# Patient Record
Sex: Male | Born: 1970 | Race: White | Hispanic: No | Marital: Married | Smoking: Never smoker
Health system: Southern US, Community
[De-identification: ages and names within clinical notes are randomized; demographics above are authoritative.]

---

## 2018-04-30 ENCOUNTER — Encounter: Payer: Self-pay | Admitting: "Endocrinology

## 2018-04-30 LAB — TSH: TSH: 0.01 — AB (ref 0.41–5.90)

## 2018-05-07 ENCOUNTER — Encounter: Payer: Self-pay | Admitting: "Endocrinology

## 2018-08-14 ENCOUNTER — Encounter: Payer: Self-pay | Admitting: "Endocrinology

## 2018-08-14 ENCOUNTER — Ambulatory Visit (INDEPENDENT_AMBULATORY_CARE_PROVIDER_SITE_OTHER): Payer: Self-pay | Admitting: "Endocrinology

## 2018-08-14 VITALS — BP 130/82 | HR 108 | Wt 157.0 lb

## 2018-08-14 DIAGNOSIS — E0590 Thyrotoxicosis, unspecified without thyrotoxic crisis or storm: Secondary | ICD-10-CM | POA: Insufficient documentation

## 2018-08-14 DIAGNOSIS — E0500 Thyrotoxicosis with diffuse goiter without thyrotoxic crisis or storm: Secondary | ICD-10-CM | POA: Insufficient documentation

## 2018-08-14 MED ORDER — PROPRANOLOL HCL 10 MG PO TABS: 10.0000 mg | ORAL_TABLET | Freq: Three times a day (TID) | ORAL | 2 refills | Status: DC

## 2018-08-14 NOTE — Progress Notes (Signed)
Endocrinology Consult Note    Subjective:    Patient ID: Kevin May, male    DOB: Nov 17, 1970, PCP Clemon ChambersGiles, April Y, NP.   History reviewed. No pertinent past medical history. History reviewed. No pertinent surgical history. Social History   Socioeconomic History  . Marital status: Married    Spouse name: Not on file  . Number of children: Not on file  . Years of education: Not on file  . Highest education level: Not on file  Occupational History  . Not on file  Social Needs  . Financial resource strain: Not on file  . Food insecurity:    Worry: Not on file    Inability: Not on file  . Transportation needs:    Medical: Not on file    Non-medical: Not on file  Tobacco Use  . Smoking status: Not on file  Substance and Sexual Activity  . Alcohol use: Not on file  . Drug use: Not on file  . Sexual activity: Not on file  Lifestyle  . Physical activity:    Days per week: Not on file    Minutes per session: Not on file  . Stress: Not on file  Relationships  . Social connections:    Talks on phone: Not on file    Gets together: Not on file    Attends religious service: Not on file    Active member of club or organization: Not on file    Attends meetings of clubs or organizations: Not on file    Relationship status: Not on file  Other Topics Concern  . Not on file  Social History Narrative  . Not on file   Outpatient Encounter Medications as of 08/14/2018  Medication Sig  . propranolol (INDERAL) 10 MG tablet Take 1 tablet (10 mg total) by mouth 3 (three) times daily with meals.  . [DISCONTINUED] propranolol (INDERAL) 10 MG tablet Take 10 mg by mouth 3 (three) times daily with meals.   No facility-administered encounter medications on file as of 08/14/2018.     ALLERGIES: Not on File  VACCINATION STATUS:  There is no immunization history on file for this patient.   HPI  Kevin May is 47 y.o. male who presents today with a medical history as  above. he is being seen in consultation for hyperthyroidism requested by Quintin AltoGiles, April Y, NP.  he has been dealing with symptoms of weight loss of 50 pounds, hypertensions/heat intolerance, tremors, sleep disturbance , and fatigue for approximately 3 months.  his most recent thyroid labs revealed suppressed TSH and elevated free T4.  His subsequent thyroid uptake and scan on May 07, 2018 showed 24-hour uptake of 53.7 with no evidence of nodular lesions.  he denies dysphagia, choking, shortness of breath, no recent voice change. These symptoms are progressively worsening and troubling to him.  He was initiated on propanolol 10 mg p.o. daily with minimal symptom relief.   he warts family history of thyroid dysfunction-various degrees of thyroid dysfunction in his siblings and mother.  He denies family history of thyroid malignancy.   -  he is not on any anti-thyroid medications nor on any thyroid hormone supplements.                           Review of systems  Constitutional: + weight loss, + fatigue, + subjective hyperthermia Eyes: no blurry vision, ++ xerophthalmia ENT: no sore throat, no nodules palpated in throat, no dysphagia/odynophagia,  nor hoarseness Cardiovascular: no Chest Pain, no Shortness of Breath, ++  palpitations, no leg swelling Respiratory: no cough, no SOB Gastrointestinal: no Nausea, no Vomiting, no Diarhhea Musculoskeletal: no muscle/joint aches Skin: no rashes Neurological: ++  tremors, no numbness, no tingling, no dizziness Psychiatric: no depression, -  anxiety   Objective:    BP 130/82   Pulse (!) 108   Wt 157 lb (71.2 kg)   Wt Readings from Last 3 Encounters:  08/14/18 157 lb (71.2 kg)                                                Physical exam  Constitutional:  + Appropriate weight for height, not in acute distress, + anxious state of mind Eyes: PERRLA, EOMI, + exophthalmos ENT: moist mucous membranes, +  thyromegaly, no cervical  lymphadenopathy Cardiovascular:  + Hyperactive precordial activity, ++ tachycardia, no Murmur/Rubs/Gallops Respiratory:  adequate breathing efforts, no gross chest deformity, Clear to auscultation bilaterally Gastrointestinal: abdomen soft, Non -tender, No distension, Bowel Sounds present Musculoskeletal: no gross deformities, strength intact in all four extremities Skin: moist, warm, no rashes Neurological:  +  tremor with outstretched hands,  ++ Deep Tendon Reflexes  on both lower extremities.  April 30, 2018 labs TSH <0.01, free T4 elevated at 2.8. Thyroid uptake and scan on May 07, 2018 showed a large homogeneous uptake throughout the gland.  Elevated 24-hour uptake of 53.7%.  No evidence of thyroid nodule.    Assessment & Plan:   1. 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 his history and most recent labs are reviewed, and he was examined clinically. Subjective and objective findings are consistent with thyrotoxicosis likely from primary hyperthyroidism from Graves' disease. The potential risks of untreated thyrotoxicosis and the need for definitive therapy have been discussed in detail with him, and he agrees to proceed with treatment plan.    Options of therapy are discussed with him.  We discussed the option of treating it with medications including methimazole or PTU which may have side effects including rash, transaminitis, and bone marrow suppression.  We  also discussed the option of definitive therapy with RAI ablation of the thyroid.  I favored and he  agrees with this option of treatment despite the fact that there is a risk of post ablative hypothyroidism with subsequent need for lifelong thyroid hormone replacement. he is made aware of this outcome  and he is  willing to proceed.  Although surgery is one other choice of treatment in some cases, in his case surgery is not a good fit for presentation with only  mild goiter.    I discussed and increase his propranolol to 10 mg p.o. 3 times daily for symptomatic relief.  - I advised him to maintain close follow up with Quintin Alto, NP for primary care needs.   - Time spent with the patient: 35 minutes, of which >50% was spent in obtaining information about his symptoms, reviewing his previous labs, evaluations, and treatments, counseling him about his hyperthyroidism from Graves' disease, and developing a plan to confirm the diagnosis and long term treatment as necessary.  Kevin May participated in the discussions, expressed understanding, and voiced agreement with the above plans.  All questions were answered to his satisfaction. he is encouraged  to contact clinic should he have any questions or concerns prior to his return visit.  Follow up plan: Return in about 8 weeks (around 10/09/2018) for Follow up with Labs after I131 Therapy.   Thank you for involving me in the care of this pleasant patient, and I will continue to update you with his progress.  Marquis LunchGebre Walfred Bettendorf, MD [facility] Phone: [phone]  Fax: [phone]   08/14/2018, 2:23 PM  This note was partially dictated with voice recognition software. Similar sounding words can be transcribed inadequately or may not  be corrected upon review.

## 2018-08-31 ENCOUNTER — Ambulatory Visit (HOSPITAL_COMMUNITY): Payer: Self-pay

## 2018-09-07 ENCOUNTER — Encounter (HOSPITAL_COMMUNITY)
Admission: RE | Admit: 2018-09-07 | Discharge: 2018-09-07 | Disposition: A | Discharge status: Home / Self Care | Payer: Self-pay | Source: Ambulatory Visit | Attending: "Endocrinology | Admitting: "Endocrinology

## 2018-09-07 DIAGNOSIS — E0590 Thyrotoxicosis, unspecified without thyrotoxic crisis or storm: Secondary | ICD-10-CM | POA: Insufficient documentation

## 2018-09-07 MED ORDER — SODIUM IODIDE I 131 CAPSULE
18.0000 | Freq: Once | INTRAVENOUS | Status: AC | PRN
  Administered 2018-09-07: 18.19 via ORAL

## 2018-10-09 ENCOUNTER — Ambulatory Visit: Payer: Self-pay | Admitting: "Endocrinology

## 2018-10-30 ENCOUNTER — Other Ambulatory Visit: Payer: Self-pay

## 2018-10-30 ENCOUNTER — Encounter: Payer: Self-pay | Admitting: "Endocrinology

## 2018-10-30 ENCOUNTER — Ambulatory Visit (INDEPENDENT_AMBULATORY_CARE_PROVIDER_SITE_OTHER): Payer: Self-pay | Admitting: "Endocrinology

## 2018-10-30 VITALS — BP 132/87 | HR 73 | Ht 70.0 in | Wt 178.0 lb

## 2018-10-30 DIAGNOSIS — E890 Postprocedural hypothyroidism: Secondary | ICD-10-CM | POA: Insufficient documentation

## 2018-10-30 MED ORDER — LEVOTHYROXINE SODIUM 88 MCG PO TABS: 88.0000 ug | ORAL_TABLET | Freq: Every day | ORAL | 3 refills | Status: DC

## 2018-10-30 NOTE — Progress Notes (Signed)
Endocrinology follow-up note    Subjective:    Patient ID: Kevin May, male    DOB: 11-08-1970, PCP Kevin May Kevin May, Kevin May.   History reviewed. No pertinent past medical history. History reviewed. No pertinent surgical history. Social History   Socioeconomic History  . Marital status: Married    Spouse name: Not on file  . Number of children: Not on file  . Years of education: Not on file  . Highest education level: Not on file  Occupational History  . Not on file  Social Needs  . Financial resource strain: Not on file  . Food insecurity:    Worry: Not on file    Inability: Not on file  . Transportation needs:    Medical: Not on file    Non-medical: Not on file  Tobacco Use  . Smoking status: Never Smoker  . Smokeless tobacco: Never Used  Substance and Sexual Activity  . Alcohol use: Not Currently  . Drug use: Never  . Sexual activity: Not on file  Lifestyle  . Physical activity:    Days per week: Not on file    Minutes per session: Not on file  . Stress: Not on file  Relationships  . Social connections:    Talks on phone: Not on file    Gets together: Not on file    Attends religious service: Not on file    Active member of club or organization: Not on file    Attends meetings of clubs or organizations: Not on file    Relationship status: Not on file  Other Topics Concern  . Not on file  Social History Narrative  . Not on file   Outpatient Encounter Medications as of 10/30/2018  Medication Sig  . levothyroxine (SYNTHROID, LEVOTHROID) 88 MCG tablet Take 1 tablet (88 mcg total) by mouth daily before breakfast.  . [DISCONTINUED] propranolol (INDERAL) 10 MG tablet Take 1 tablet (10 mg total) by mouth 3 (three) times daily with meals.   No facility-administered encounter medications on file as of 10/30/2018.     ALLERGIES: No Known Allergies  VACCINATION STATUS:  There is no immunization history on file for this patient.   HPI  Kevin May is  48 May.o. male who presents today with a medical history as above. he is status post RAI thyroid ablation on September 07, 2018. He is returning for follow-up.  He feels better, regaining some of the weight he lost prior to treatment.  He lost approximately 50 pounds of weight when he was thyrotoxic.  He sleeps better, has better energy.  He denies palpitations, tremors, heat intolerance.    -His thyroid uptake and scan on May 07, 2018 showed 24-hour uptake of 53.7 % with no evidence of nodular lesions.  he denies dysphagia, choking, shortness of breath, no recent voice change. -He remains on propanolol 10 mg p.o. twice daily.    he reports  family history of thyroid dysfunction-various degrees of thyroid dysfunction in his siblings and mother.  He denies family history of thyroid malignancy.                            Review of systems  Constitutional: + weight gain, no fatigue, no subjective hyperthermia  Eyes: no blurry vision, + xerophthalmia ENT: no sore throat, no nodules palpated in throat, no dysphagia/odynophagia, nor hoarseness Cardiovascular: no Chest Pain, no Shortness of Breath, - palpitations, no leg swelling Respiratory: no cough,  no SOB Gastrointestinal: no Nausea, no Vomiting, no Diarhhea Musculoskeletal: no muscle/joint aches Skin: no rashes Neurological: -  tremors, no numbness, no tingling, no dizziness Psychiatric: no depression, -  anxiety   Objective:    BP 132/87   Pulse 73   Ht 5\' 10"  (1.778 m)   Wt 178 lb (80.7 kg)   BMI 25.54 kg/m   Wt Readings from Last 3 Encounters:  10/30/18 178 lb (80.7 kg)  08/14/18 157 lb (71.2 kg)                                                Physical exam  Constitutional:  + Appropriate weight for height, not in acute distress, - anxious state of mind Eyes: PERRLA, EOMI, + exophthalmos ENT: moist mucous membranes, +  thyromegaly, no cervical lymphadenopathy Cardiovascular:  + Normal precordial activity, no tachycardia.    Musculoskeletal: no gross deformities, strength intact in all four extremities Skin: moist, warm, no rashes Neurological:  -  tremor with outstretched hands,  - Deep Tendon Reflexes  on both lower extremities.  April 30, 2018 labs TSH <0.01, free T4 elevated at 2.8. Thyroid uptake and scan on May 07, 2018 showed a large homogeneous uptake throughout the gland.  Elevated 24-hour uptake of 53.7%.  No evidence of thyroid nodule.  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 Graves' disease-resolved  -He returns with thyroid function tests indicative of treatment effect with RAI for Graves' disease. -He will benefit from initiation of thyroid hormone replacement.  I discussed and initiated levothyroxine 88 mcg p.o. every morning.   - We discussed about the correct intake of his thyroid hormone, on empty stomach at fasting, with water, separated by at least 30 minutes from breakfast and other medications,  and separated by more than 4 hours from calcium, iron, multivitamins, acid reflux medications (PPIs). -Patient is made aware of the fact that thyroid hormone replacement is needed for life, dose to be adjusted by periodic monitoring of thyroid function tests.  -Who advised to discontinue propranolol at this time.  - I advised him to maintain close follow up with Kevin May, Kevin May for primary care needs.   Follow up plan: Return in about 3 months (around 01/30/2019) for Follow up with Pre-visit Labs.   Thank you for involving me in the care of this pleasant patient, and I will continue to update you with his progress.  Marquis Lunch, MD [facility] Phone: [phone]  Fax: [phone]   10/30/2018, 10:28 AM  This note was partially dictated with voice recognition software. Similar sounding words can be  transcribed inadequately or May not  be corrected upon review.

## 2018-11-08 ENCOUNTER — Other Ambulatory Visit: Payer: Self-pay | Admitting: "Endocrinology

## 2019-01-22 ENCOUNTER — Other Ambulatory Visit: Payer: Self-pay | Admitting: "Endocrinology

## 2019-01-23 LAB — T4, FREE: FREE T4: 2 ng/dL — AB (ref 0.8–1.8)

## 2019-01-23 LAB — TSH: TSH: 0.01 mIU/L — ABNORMAL LOW (ref 0.40–4.50)

## 2019-01-30 ENCOUNTER — Other Ambulatory Visit: Payer: Self-pay

## 2019-01-30 ENCOUNTER — Ambulatory Visit (INDEPENDENT_AMBULATORY_CARE_PROVIDER_SITE_OTHER): Payer: Self-pay | Admitting: "Endocrinology

## 2019-01-30 ENCOUNTER — Encounter: Payer: Self-pay | Admitting: "Endocrinology

## 2019-01-30 DIAGNOSIS — E890 Postprocedural hypothyroidism: Secondary | ICD-10-CM

## 2019-01-30 MED ORDER — LEVOTHYROXINE SODIUM 88 MCG PO TABS: 88.0000 ug | ORAL_TABLET | Freq: Every day | ORAL | 1 refills | Status: DC

## 2019-01-30 NOTE — Progress Notes (Signed)
01/30/2019                                Endocrinology Telehealth Visit Follow up Note -During COVID -19 Pandemic  I connected with Kevin May on 01/30/2019   by telephone and verified that I am speaking with the correct person using two identifiers. Kevin May, 04/20/1971. he has verbally consented to this visit. All issues noted in this document were discussed and addressed. The format was not optimal for physical exam.    Subjective:    Patient ID: Kevin May, male    DOB: 05-May-1971, PCP Jessee Avers April Y, NP.   History reviewed. No pertinent past medical history. History reviewed. No pertinent surgical history. Social History   Socioeconomic History  . Marital status: Married    Spouse name: Not on file  . Number of children: Not on file  . Years of education: Not on file  . Highest education level: Not on file  Occupational History  . Not on file  Social Needs  . Financial resource strain: Not on file  . Food insecurity    Worry: Not on file    Inability: Not on file  . Transportation needs    Medical: Not on file    Non-medical: Not on file  Tobacco Use  . Smoking status: Never Smoker  . Smokeless tobacco: Never Used  Substance and Sexual Activity  . Alcohol use: Not Currently  . Drug use: Never  . Sexual activity: Not on file  Lifestyle  . Physical activity    Days per week: Not on file    Minutes per session: Not on file  . Stress: Not on file  Relationships  . Social Herbalist on phone: Not on file    Gets together: Not on file    Attends religious service: Not on file    Active member of club or organization: Not on file    Attends meetings of clubs or organizations: Not on file    Relationship status: Not on file  Other Topics Concern  . Not on file  Social History Narrative  . Not on file   Outpatient Encounter Medications as of 01/30/2019  Medication Sig  . levothyroxine (SYNTHROID) 88 MCG tablet Take 1 tablet (88 mcg  total) by mouth daily before breakfast.  . propranolol (INDERAL) 10 MG tablet TAKE 1 TABLET BY MOUTH THREE TIMES A DAY WITH MEALS  . [DISCONTINUED] levothyroxine (SYNTHROID) 88 MCG tablet TAKE 1 TABLET (88 MCG TOTAL) BY MOUTH DAILY BEFORE BREAKFAST.   No facility-administered encounter medications on file as of 01/30/2019.     ALLERGIES: No Known Allergies  VACCINATION STATUS:  There is no immunization history on file for this patient.   HPI  Kevin May is 48 y.o. male who is engaged in telehealth for follow-up of RAI induced hypothyroidism.  He was initiated on levothyroxine 88 mcg p.o. every morning last visit.  He continues to feel better.  He has no new complaints today.  His previous symptoms of thyrotoxicosis have subsided.   -Steady weight, denies palpitations, tremors, nor heat intolerance.  he denies dysphagia, choking, shortness of breath, no recent voice change. -He remains on propanolol 10 mg p.o. twice daily.    he reports  family history of thyroid dysfunction-various degrees of thyroid dysfunction in his siblings and mother.  He denies family history of thyroid malignancy.  Review of systems -Limited as above.  Objective:    There were no vitals taken for this visit.  Wt Readings from Last 3 Encounters:  10/30/18 178 lb (80.7 kg)  08/14/18 157 lb (71.2 kg)                       Recent Results (from the past 2160 hour(s))  TSH     Status: Abnormal   Collection Time: 01/23/19 10:34 AM  Result Value Ref Range   TSH 0.01 (L) 0.40 - 4.50 mIU/L  T4, free     Status: Abnormal   Collection Time: 01/23/19 10:34 AM  Result Value Ref Range   Free T4 2.0 (H) 0.8 - 1.8 ng/dL     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 Graves' disease-resolved -His previsit labs are consistent with appropriate replacement.  He is  advised to continue levothyroxine 88 mcg p.o. every morning.   - We discussed about the correct intake of his thyroid hormone, on empty stomach at fasting, with water, separated by at least 30 minutes from breakfast and other medications,  and separated by more than 4 hours from calcium, iron, multivitamins, acid reflux medications (PPIs). -Patient is made aware of the fact that thyroid hormone replacement is needed for life, dose to be adjusted by periodic monitoring of thyroid function tests.   - I advised him to maintain close follow up with Quintin AltoGiles, April Y, NP for primary care needs. Time for this visit 15 minutes.  Ronnette HilaPaul S Grothaus participated in the discussions, expressed understanding, and voiced agreement with the above plans.  All questions were answered to his satisfaction. he is encouraged to contact clinic should he have any questions or concerns prior to his return visit.   Follow up plan: Return in about 6 months (around 08/01/2019) for Follow up with Pre-visit Labs.   Thank you for involving me in the care of this pleasant patient, and I will continue to update you with his progress.  Marquis LunchGebre Ramya Vanbergen, MD [facility] Phone: [phone]  Fax: [phone]   01/30/2019, 4:04 PM  This note was partially dictated with voice recognition software. Similar sounding words can be transcribed inadequately or may not  be corrected upon review.

## 2019-07-29 LAB — T4, FREE: Free T4: 1.7 ng/dL (ref 0.8–1.8)

## 2019-07-29 LAB — TSH: TSH: 0.01 mIU/L — ABNORMAL LOW (ref 0.40–4.50)

## 2019-08-01 ENCOUNTER — Ambulatory Visit (INDEPENDENT_AMBULATORY_CARE_PROVIDER_SITE_OTHER): Payer: Self-pay | Admitting: "Endocrinology

## 2019-08-01 ENCOUNTER — Encounter: Payer: Self-pay | Admitting: "Endocrinology

## 2019-08-01 DIAGNOSIS — E890 Postprocedural hypothyroidism: Secondary | ICD-10-CM

## 2019-08-01 MED ORDER — LEVOTHYROXINE SODIUM 75 MCG PO TABS: 75.0000 ug | ORAL_TABLET | Freq: Every day | ORAL | 1 refills | Status: DC

## 2019-08-01 NOTE — Progress Notes (Signed)
08/01/2019                                Endocrinology Telehealth Visit Follow up Note -During COVID -19 Pandemic  I connected with Kevin May on 08/01/2019   by telephone and verified that I am speaking with the correct person using two identifiers. Kevin May, Apr 07, 1971. he has verbally consented to this visit. All issues noted in this document were discussed and addressed. The format was not optimal for physical exam.    Subjective:    Patient ID: Kevin May, male    DOB: September 29, 1970, PCP Jessee Avers April Y, NP.   History reviewed. No pertinent past medical history. History reviewed. No pertinent surgical history. Social History   Socioeconomic History  . Marital status: Married    Spouse name: Not on file  . Number of children: Not on file  . Years of education: Not on file  . Highest education level: Not on file  Occupational History  . Not on file  Tobacco Use  . Smoking status: Never Smoker  . Smokeless tobacco: Never Used  Substance and Sexual Activity  . Alcohol use: Not Currently  . Drug use: Never  . Sexual activity: Not on file  Other Topics Concern  . Not on file  Social History Narrative  . Not on file   Social Determinants of Health   Financial Resource Strain:   . Difficulty of Paying Living Expenses: Not on file  Food Insecurity:   . Worried About Charity fundraiser in the Last Year: Not on file  . Ran Out of Food in the Last Year: Not on file  Transportation Needs:   . Lack of Transportation (Medical): Not on file  . Lack of Transportation (Non-Medical): Not on file  Physical Activity:   . Days of Exercise per Week: Not on file  . Minutes of Exercise per Session: Not on file  Stress:   . Feeling of Stress : Not on file  Social Connections:   . Frequency of Communication with Friends and Family: Not on file  . Frequency of Social Gatherings with Friends and Family: Not on file  . Attends Religious Services: Not on file  . Active  Member of Clubs or Organizations: Not on file  . Attends Archivist Meetings: Not on file  . Marital Status: Not on file   Outpatient Encounter Medications as of 08/01/2019  Medication Sig  . levothyroxine (SYNTHROID) 75 MCG tablet Take 1 tablet (75 mcg total) by mouth daily before breakfast.  . [DISCONTINUED] levothyroxine (SYNTHROID) 88 MCG tablet Take 1 tablet (88 mcg total) by mouth daily before breakfast.  . [DISCONTINUED] propranolol (INDERAL) 10 MG tablet TAKE 1 TABLET BY MOUTH THREE TIMES A DAY WITH MEALS   No facility-administered encounter medications on file as of 08/01/2019.    ALLERGIES: No Known Allergies  VACCINATION STATUS:  There is no immunization history on file for this patient.   HPI  Kevin May is 48 y.o. male who is engaged in telehealth for follow-up of RAI induced hypothyroidism.  He is currently on levothyroxine 88 mcg p.o. daily before breakfast.  His previsit labs show evidence of slight over replacement.  He has no new complaints today.    -He reports steady weight, denies palpitations, tremors, nor heat intolerance.  he denies dysphagia, choking, shortness of breath, no recent voice change. -He remains on propanolol 10  mg p.o. twice daily.    he reports  family history of thyroid dysfunction-various degrees of thyroid dysfunction in his siblings and mother.  He denies family history of thyroid malignancy.                            Review of systems -Limited as above.  Objective:    There were no vitals taken for this visit.  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 Graves' disease-resolved -His previsit labs are consistent with slight over replacement.  I discussed and lowered his levothyroxine to 75 mcg p.o. daily before breakfast.     - We discussed about the correct intake of his thyroid hormone, on empty stomach at fasting, with water, separated by at least 30 minutes from breakfast and other medications,  and separated by more than 4 hours from calcium, iron, multivitamins, acid reflux medications (PPIs). -Patient is made aware of the fact that thyroid hormone replacement is needed for life, dose to be adjusted by periodic monitoring of thyroid function tests.  - I advised him to maintain close follow up with Quintin Alto, NP for primary care needs.   Time for this visit: 15 minutes. Ronnette Hila  participated in the discussions, expressed understanding, and voiced agreement with the above plans.  All questions were answered to his satisfaction. he is encouraged to contact clinic should he have any questions or concerns prior to his return visit.   Follow up plan: Return in about 4 months (around 11/30/2019) for Follow up with Pre-visit Labs.   Thank you for involving me in the care of this pleasant patient, and I will continue to update you with his progress.  Marquis Lunch, MD [facility] Phone: [phone]  Fax: [phone]   08/01/2019, 12:15 PM  This note was partially dictated with voice recognition software. Similar sounding words can be transcribed inadequately or may not  be corrected upon review.

## 2019-11-28 LAB — T4, FREE: Free T4: 1.5 ng/dL (ref 0.8–1.8)

## 2019-11-28 LAB — TSH: TSH: 0.01 mIU/L — ABNORMAL LOW (ref 0.40–4.50)

## 2019-12-03 ENCOUNTER — Other Ambulatory Visit: Payer: Self-pay

## 2019-12-03 ENCOUNTER — Encounter: Payer: Self-pay | Admitting: "Endocrinology

## 2019-12-03 ENCOUNTER — Ambulatory Visit (INDEPENDENT_AMBULATORY_CARE_PROVIDER_SITE_OTHER): Payer: Self-pay | Admitting: "Endocrinology

## 2019-12-03 VITALS — BP 139/85 | HR 103 | Ht 70.0 in | Wt 196.0 lb

## 2019-12-03 DIAGNOSIS — E890 Postprocedural hypothyroidism: Secondary | ICD-10-CM

## 2019-12-03 MED ORDER — LEVOTHYROXINE SODIUM 125 MCG PO TABS: 62.5000 ug | ORAL_TABLET | Freq: Every day | ORAL | 1 refills | Status: DC

## 2019-12-03 NOTE — Progress Notes (Signed)
12/03/2019                Endocrinology follow-up note   Subjective:    Patient ID: Kevin May, male    DOB: 1970-10-24, PCP [name], NP.   History reviewed. No pertinent past medical history. History reviewed. No pertinent surgical history. Social History   Socioeconomic History  . Marital status: Married    Spouse name: Not on file  . Number of children: Not on file  . Years of education: Not on file  . Highest education level: Not on file  Occupational History  . Not on file  Tobacco Use  . Smoking status: Never Smoker  . Smokeless tobacco: Never Used  Substance and Sexual Activity  . Alcohol use: Not Currently  . Drug use: Never  . Sexual activity: Not on file  Other Topics Concern  . Not on file  Social History Narrative  . Not on file   Social Determinants of Health   Financial Resource Strain:   . Difficulty of Paying Living Expenses:   Food Insecurity:   . Worried About Programme researcher, broadcasting/film/video in the Last Year:   . Barista in the Last Year:   Transportation Needs:   . Freight forwarder (Medical):   Marland Kitchen Lack of Transportation (Non-Medical):   Physical Activity:   . Days of Exercise per Week:   . Minutes of Exercise per Session:   Stress:   . Feeling of Stress :   Social Connections:   . Frequency of Communication with Friends and Family:   . Frequency of Social Gatherings with Friends and Family:   . Attends Religious Services:   . Active Member of Clubs or Organizations:   . Attends Banker Meetings:   Marland Kitchen Marital Status:    Outpatient Encounter Medications as of 12/03/2019  Medication Sig  . levothyroxine (SYNTHROID) 125 MCG tablet Take 0.5 tablets (62.5 mcg total) by mouth daily before breakfast.  . [DISCONTINUED] levothyroxine (SYNTHROID) 75 MCG tablet Take 1 tablet (75 mcg total) by mouth daily before breakfast.   No facility-administered encounter medications on file as of 12/03/2019.    ALLERGIES: No  Known Allergies  VACCINATION STATUS:  There is no immunization history on file for this patient.   HPI  Kevin May is 49 y.o. male who is being seen in follow-up for  RAI induced hypothyroidism.  He is currently on levothyroxine 25 mcg p.o. daily before breakfast.    His previsit labs are consistent with slight over replacement.  He states that he feels good and has no new complaints today.  He continues to regain his weight back to baseline.  -He denies palpitations, tremors, nor heat intolerance.  he denies dysphagia, choking, shortness of breath, no recent voice change.   he reports  family history of thyroid dysfunction-various degrees of thyroid dysfunction in his siblings and mother.  He denies family history of thyroid malignancy.                            Review of systems -Limited as above.  Objective:    BP 139/85   Pulse (!) 103   Ht 5\' 10"  (1.778 m)   Wt 196 lb (88.9 kg)   BMI 28.12 kg/m   Wt Readings from Last 3 Encounters:  12/03/19 196 lb (88.9 kg)  10/30/18 178 lb (80.7 kg)  08/14/18 157 lb (71.2 kg)  Physical Exam- Limited  Constitutional:  Body mass index is 28.12 kg/m. , not in acute distress, normal state of mind Eyes:  EOMI, no exophthalmos Neck: Supple Respiratory: Adequate breathing efforts Musculoskeletal: no gross deformities, strength intact in all four extremities, no gross restriction of joint movements Skin:  no rashes, no hyperemia Neurological: no tremor with outstretched hands,                      Recent Results (from the past 2160 hour(s))  TSH SOLSTAS     Status: Abnormal   Collection Time: 11/28/19 10:09 AM  Result Value Ref Range   TSH 0.01 (L) 0.40 - 4.50 mIU/L  T4, free SOLSTAS     Status: None   Collection Time: 11/28/19 10:09 AM  Result Value Ref Range   Free T4 1.5 0.8 - 1.8 ng/dL     -October 25, 2018 labs TSH 0.07, free T4 suppressed 0.62, thyroglobulin antibodies 43.3, TPO antibodies 559   Assessment &  Plan:   1.  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 Graves' disease-resolved -His previsit labs are consistent with slight over replacement.  Until he finishes his current supplies of levothyroxine 75 mcg, he is advised to skip 1 out of 7 days in a week.   -Afterwards, we will start 62.5 mcg of levothyroxine daily before breakfast.    - We discussed about the correct intake of his thyroid hormone, on empty stomach at fasting, with water, separated by at least 30 minutes from breakfast and other medications,  and separated by more than 4 hours from calcium, iron, multivitamins, acid reflux medications (PPIs). -Patient is made aware of the fact that thyroid hormone replacement is needed for life, dose to be adjusted by periodic monitoring of thyroid function tests.  - I advised him to maintain close follow up with [name], NP for primary care needs.      - Time spent on this patient care encounter:  22 minutes of which 50% was spent in  counseling and the rest reviewing  his current and  previous labs / studies and medications  doses and developing a plan for long term care. [name]  participated in the discussions, expressed understanding, and voiced agreement with the above plans.  All questions were answered to his satisfaction. he is encouraged to contact clinic should he have any questions or concerns prior to his return visit.  Follow up plan: Return in about 4 months (around 04/03/2020) for Follow up with Pre-visit Labs.   Thank you for involving me in the care of this pleasant patient, and I will continue to update you with his progress.  Glade Lloyd, MD [facility] Phone: [phone]  Fax: [phone]   12/03/2019, 12:31 PM  This note was partially dictated with voice recognition software. Similar sounding words can be transcribed inadequately or may not  be corrected upon review.

## 2020-01-21 ENCOUNTER — Other Ambulatory Visit: Payer: Self-pay | Admitting: "Endocrinology

## 2020-02-05 IMAGING — NM NM RAI THERAPY FOR HYPERTHYROIDISM
1 series · 1 of 1 positions shown · non-contrast
Comparison: Outside thyroid uptake and scan from [REDACTED]
[HOSPITAL] [HOSPITAL], on CD

CLINICAL DATA: Hyperthyroidism

EXAM:
RADIOACTIVE IODINE THERAPY FOR HYPERTHYROIDISM
TECHNIQUE: Procedure, benefits and risks of radioactive iodine therapy for
hyperthyroidism were discussed with the patient, as well as
alternatives.

[Series 1: bone statics · 2.07mm/px · 1 of 1 slices shown]
[im 1/1]
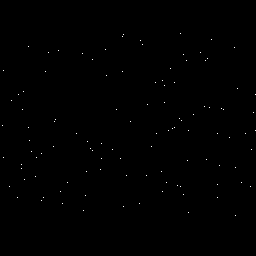

[1 of 1 positions shown; findings below may reference images not displayed]

Radiation safety issues and procedures were discussed, including
minimization of radiation risk to family and general public.

Written safety instructions were provided to the patient.

Patient's questions were answered.

Written informed consent for the therapy was obtained.

Timeout protocol followed.

Patient then received the radiopharmaceutical without immediate
complication.

RADIOPHARMACEUTICALS:  18.19 mCi P-1T1 sodium iodide orally
IMPRESSION: Per oral administration of P-1T1 sodium iodide for the treatment of
hyperthyroidism.

## 2020-04-03 ENCOUNTER — Ambulatory Visit: Payer: Self-pay | Admitting: Nurse Practitioner

## 2020-04-07 LAB — T4, FREE: Free T4: 1.2 ng/dL (ref 0.8–1.8)

## 2020-04-07 LAB — TSH: TSH: 1.63 mIU/L (ref 0.40–4.50)

## 2020-04-16 ENCOUNTER — Encounter: Payer: Self-pay | Admitting: Nurse Practitioner

## 2020-04-16 ENCOUNTER — Telehealth (INDEPENDENT_AMBULATORY_CARE_PROVIDER_SITE_OTHER): Payer: Self-pay | Admitting: Nurse Practitioner

## 2020-04-16 ENCOUNTER — Other Ambulatory Visit: Payer: Self-pay

## 2020-04-16 VITALS — Wt 189.0 lb

## 2020-04-16 DIAGNOSIS — E890 Postprocedural hypothyroidism: Secondary | ICD-10-CM

## 2020-04-16 NOTE — Patient Instructions (Signed)
-   The correct intake of thyroid hormone (Levothyroxine, Synthroid), is on empty stomach first thing in the morning, with water, separated by at least 30 minutes from breakfast and other medications,  and separated by more than 4 hours from calcium, iron, multivitamins, acid reflux medications (PPIs). ° °- This medication is a life-long medication and will be needed to correct thyroid hormone imbalances for the rest of your life.  The dose may change from time to time, based on thyroid blood work. ° °- It is extremely important to be consistent taking this medication, near the same time each morning. °

## 2020-04-16 NOTE — Progress Notes (Signed)
04/16/2020                Endocrinology follow-up note  TELEHEALTH VISIT: The patient is being engaged in telehealth visit due to COVID-19.  This type of visit limits physical examination significantly, and thus is not preferable over face-to-face encounters.  I connected with  Kevin May on 04/16/20 by a video enabled telemedicine application and verified that I am speaking with the correct person using two identifiers.   I discussed the limitations of evaluation and management by telemedicine. The patient expressed understanding and agreed to proceed.   The participants involved in this visit include: Dani Gobble, NP located at [facility] and Ronnette Hila  located at their personal residence listed.  Subjective:    Patient ID: Kevin May, male    DOB: Sep 29, 1970, PCP [name], NP.   History reviewed. No pertinent past medical history. History reviewed. No pertinent surgical history. Social History   Socioeconomic History   Marital status: Married    Spouse name: Not on file   Number of children: Not on file   Years of education: Not on file   Highest education level: Not on file  Occupational History   Not on file  Tobacco Use   Smoking status: Never Smoker   Smokeless tobacco: Never Used  Substance and Sexual Activity   Alcohol use: Not Currently   Drug use: Never   Sexual activity: Not on file  Other Topics Concern   Not on file  Social History Narrative   Not on file   Social Determinants of Health   Financial Resource Strain:    Difficulty of Paying Living Expenses: Not on file  Food Insecurity:    Worried About Running Out of Food in the Last Year: Not on file   Ran Out of Food in the Last Year: Not on file  Transportation Needs:    Lack of Transportation (Medical): Not on file   Lack of Transportation (Non-Medical): Not on file  Physical Activity:    Days of Exercise per Week: Not on  file   Minutes of Exercise per Session: Not on file  Stress:    Feeling of Stress : Not on file  Social Connections:    Frequency of Communication with Friends and Family: Not on file   Frequency of Social Gatherings with Friends and Family: Not on file   Attends Religious Services: Not on file   Active Member of Clubs or Organizations: Not on file   Attends Club or Organization Meetings: Not on file   Marital Status: Not on file   Outpatient Encounter Medications as of 04/16/2020  Medication Sig   levothyroxine (SYNTHROID) 125 MCG tablet Take 0.5 tablets (62.5 mcg total) by mouth daily before breakfast.   No facility-administered encounter medications on file as of 04/16/2020.    ALLERGIES: No Known Allergies  VACCINATION STATUS:  There is no immunization history on file for this patient. Thyroid Problem Presents for follow-up visit. Patient reports no anxiety, cold intolerance, constipation, depressed mood, diarrhea, fatigue, heat intolerance, leg swelling, palpitations, tremors, weight gain or weight loss.    HPI  Kevin May is 49 y.o. male who is being seen in follow-up for RAI induced hypothyroidism.  He is currently taking 62.5 mcg of levothyroxine daily. He states that he feels good and has no new complaints today.  He has a steady weight. -He denies palpitations, tremors, nor heat intolerance.  he denies dysphagia, choking,  shortness of breath, no recent voice change.   he reports  family history of thyroid dysfunction-various degrees of thyroid dysfunction in his siblings and mother.  He denies family history of thyroid malignancy.                            Review of systems Review of Systems  Constitutional: Negative for fatigue, weight gain and weight loss.  Cardiovascular: Negative for palpitations and leg swelling.  Gastrointestinal: Negative for constipation and diarrhea.  Neurological: Negative for tremors.  Endo/Heme/Allergies: Negative for cold  intolerance and heat intolerance.  Psychiatric/Behavioral: The patient is not nervous/anxious.     Objective:    Wt 189 lb (85.7 kg)    BMI 27.12 kg/m   Wt Readings from Last 3 Encounters:  04/16/20 189 lb (85.7 kg)  12/03/19 196 lb (88.9 kg)  10/30/18 178 lb (80.7 kg)    BP Readings from Last 3 Encounters:  12/03/19 139/85  10/30/18 132/87  08/14/18 130/82    Physical Exam- Telehealth- significantly limited due to nature of visit  Constitutional: Body mass index is 27.12 kg/m. , not in acute distress, normal state of mind Respiratory: Adequate breathing efforts                     Recent Results (from the past 2160 hour(s))  TSH     Status: None   Collection Time: 04/07/20 10:14 AM  Result Value Ref Range   TSH 1.63 0.40 - 4.50 mIU/L  T4, free     Status: None   Collection Time: 04/07/20 10:14 AM  Result Value Ref Range   Free T4 1.2 0.8 - 1.8 ng/dL     -October 25, 2018 labs TSH 0.07, free T4 suppressed 0.62, thyroglobulin antibodies 43.3, TPO antibodies 559   Assessment & Plan:   1. 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 Graves' disease-resolved  -His previsit labs are consistent with appropriate replacement.  He is advised to continue Levothyroxine 62.5 mg (1/2 tab of the 125 mg tabs) po daily before breakfast.  Will recheck thyroid function tests prior to next visit in 4 months.   - We discussed about the correct intake of his thyroid hormone, on empty stomach at fasting, with water, separated by at least 30 minutes from breakfast and other medications,  and separated by more than 4 hours from calcium, iron, multivitamins, acid reflux medications (PPIs). -Patient is made aware of the fact that thyroid hormone replacement is needed for life, dose to be adjusted by periodic monitoring of thyroid function tests.  - I advised him to maintain close follow up with Quintin Alto, NP for primary care needs.  I spent 20 minutes dedicated to the care of this patient on the date  of this encounter to include pre-visit review of records, face-to-face time with the patient, and post visit ordering of  testing.  Ronnette Hila  participated in the discussions, expressed understanding, and voiced agreement with the above plans.  All questions were answered to his satisfaction. he is encouraged to contact clinic should he have any questions or concerns prior to his return visit.  Follow up plan: Return in about 4 months (around 08/16/2020) for Thyroid follow up, Previsit labs, Virtual visit ok.   Ronny Bacon, FNP-BC [facility] Phone: [phone] Fax: [phone]  04/16/2020, 9:39 AM  This note was partially dictated with voice recognition software. Similar sounding words can be transcribed inadequately or  may not  be corrected upon review.

## 2020-05-24 ENCOUNTER — Other Ambulatory Visit: Payer: Self-pay | Admitting: "Endocrinology

## 2020-08-12 ENCOUNTER — Other Ambulatory Visit: Payer: Self-pay | Admitting: Nurse Practitioner

## 2020-08-13 LAB — T4, FREE: Free T4: 1.2 ng/dL (ref 0.8–1.8)

## 2020-08-13 LAB — TSH: TSH: 1.85 mIU/L (ref 0.40–4.50)

## 2020-08-18 ENCOUNTER — Other Ambulatory Visit: Payer: Self-pay

## 2020-08-18 ENCOUNTER — Encounter: Payer: Self-pay | Admitting: "Endocrinology

## 2020-08-18 ENCOUNTER — Ambulatory Visit (INDEPENDENT_AMBULATORY_CARE_PROVIDER_SITE_OTHER): Payer: Self-pay | Admitting: "Endocrinology

## 2020-08-18 VITALS — BP 143/88 | HR 73 | Ht 70.0 in | Wt 202.2 lb

## 2020-08-18 DIAGNOSIS — E890 Postprocedural hypothyroidism: Secondary | ICD-10-CM

## 2020-08-18 NOTE — Progress Notes (Signed)
08/18/2020                Endocrinology follow-up note   Subjective:    Patient ID: Kevin May, male    DOB: February 26, 1971, PCP [name], NP.   History reviewed. No pertinent past medical history. History reviewed. No pertinent surgical history. Social History   Socioeconomic History  . Marital status: Married    Spouse name: Not on file  . Number of children: Not on file  . Years of education: Not on file  . Highest education level: Not on file  Occupational History  . Not on file  Tobacco Use  . Smoking status: Never Smoker  . Smokeless tobacco: Never Used  Substance and Sexual Activity  . Alcohol use: Not Currently  . Drug use: Never  . Sexual activity: Not on file  Other Topics Concern  . Not on file  Social History Narrative  . Not on file   Social Determinants of Health   Financial Resource Strain: Not on file  Food Insecurity: Not on file  Transportation Needs: Not on file  Physical Activity: Not on file  Stress: Not on file  Social Connections: Not on file   Outpatient Encounter Medications as of 08/18/2020  Medication Sig  . levothyroxine (SYNTHROID) 125 MCG tablet TAKE 0.5 TABLETS (62.5 MCG TOTAL) BY MOUTH DAILY BEFORE BREAKFAST.   No facility-administered encounter medications on file as of 08/18/2020.    ALLERGIES: No Known Allergies  VACCINATION STATUS:  There is no immunization history on file for this patient.   HPI  Kevin May is 50 y.o. male who is being seen in follow-up for  RAI induced hypothyroidism.  He is currently on levothyroxine 62.5 mcg p.o. daily before breakfast.   His previsit labs are consistent with appropriate replacement.  Has no new complaints today.  He continues to feel better, regained most of the weight he lost when he was hyperthyroid.   -He denies palpitations, tremors, nor heat intolerance.  he denies dysphagia, choking, shortness of breath, no recent voice change.   he reports  family  history of thyroid dysfunction-various degrees of thyroid dysfunction in his siblings and mother.  He denies family history of thyroid malignancy.                            Review of systems -Limited as above.  Objective:    BP (!) 143/88   Pulse 73   Ht 5\' 10"  (1.778 m)   Wt 202 lb 3.2 oz (91.7 kg)   BMI 29.01 kg/m   Wt Readings from Last 3 Encounters:  08/18/20 202 lb 3.2 oz (91.7 kg)  04/16/20 189 lb (85.7 kg)  12/03/19 196 lb (88.9 kg)     Physical Exam- Limited                       Recent Results (from the past 2160 hour(s))  TSH     Status: None   Collection Time: 08/12/20 12:00 AM  Result Value Ref Range   TSH 1.85 0.40 - 4.50 mIU/L  T4, free     Status: None   Collection Time: 08/12/20 12:00 AM  Result Value Ref Range   Free T4 1.2 0.8 - 1.8 ng/dL     -October 25, 2018 labs TSH 0.07, free T4 suppressed 0.62, thyroglobulin antibodies 43.3, TPO antibodies 559   Assessment &  Plan:   1.  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 Graves' disease-resolved -His previsit labs are consistent with appropriate replacement.  He is advised to continue levothyroxine 62.5 mcg p.o. daily before breakfast.     - We discussed about the correct intake of his thyroid hormone, on empty stomach at fasting, with water, separated by at least 30 minutes from breakfast and other medications,  and separated by more than 4 hours from calcium, iron, multivitamins, acid reflux medications (PPIs). -Patient is made aware of the fact that thyroid hormone replacement is needed for life, dose to be adjusted by periodic monitoring of thyroid function tests.   - I advised him to maintain close follow up with Quintin Alto, NP for primary care needs.     - Time spent on this patient care encounter:  22 minutes of which 50% was spent in  counseling and the rest reviewing  his current and  previous labs / studies and medications  doses and developing a plan for long term care. Ronnette Hila  participated  in the discussions, expressed understanding, and voiced agreement with the above plans.  All questions were answered to his satisfaction. he is encouraged to contact clinic should he have any questions or concerns prior to his return visit.  Follow up plan: Return in about 6 months (around 02/15/2021) for F/U with Pre-visit Labs.   Thank you for involving me in the care of this pleasant patient, and I will continue to update you with his progress.  Marquis Lunch, MD [facility] Phone: [phone]  Fax: [phone]   08/18/2020, 1:24 PM  This note was partially dictated with voice recognition software. Similar sounding words can be transcribed inadequately or may not  be corrected upon review.

## 2020-12-03 ENCOUNTER — Other Ambulatory Visit: Payer: Self-pay | Admitting: Nurse Practitioner

## 2021-02-10 LAB — TSH: TSH: 2.37 u[IU]/mL (ref 0.450–4.500)

## 2021-02-10 LAB — T4, FREE: Free T4: 1.45 ng/dL (ref 0.82–1.77)

## 2021-02-16 ENCOUNTER — Encounter: Payer: Self-pay | Admitting: Nurse Practitioner

## 2021-02-16 ENCOUNTER — Ambulatory Visit (INDEPENDENT_AMBULATORY_CARE_PROVIDER_SITE_OTHER): Payer: Self-pay | Admitting: Nurse Practitioner

## 2021-02-16 ENCOUNTER — Other Ambulatory Visit: Payer: Self-pay

## 2021-02-16 VITALS — BP 134/88 | HR 73 | Ht 70.0 in | Wt 200.0 lb

## 2021-02-16 DIAGNOSIS — E890 Postprocedural hypothyroidism: Secondary | ICD-10-CM

## 2021-02-16 NOTE — Progress Notes (Signed)
02/16/2021                Endocrinology follow-up note   Subjective:    Patient ID: Kevin May, male    DOB: 1971/06/22, PCP [name], NP.   History reviewed. No pertinent past medical history. History reviewed. No pertinent surgical history. Social History   Socioeconomic History   Marital status: Married    Spouse name: Not on file   Number of children: Not on file   Years of education: Not on file   Highest education level: Not on file  Occupational History   Not on file  Tobacco Use   Smoking status: Never   Smokeless tobacco: Never  Substance and Sexual Activity   Alcohol use: Not Currently   Drug use: Never   Sexual activity: Not on file  Other Topics Concern   Not on file  Social History Narrative   Not on file   Social Determinants of Health   Financial Resource Strain: Not on file  Food Insecurity: Not on file  Transportation Needs: Not on file  Physical Activity: Not on file  Stress: Not on file  Social Connections: Not on file   Outpatient Encounter Medications as of 02/16/2021  Medication Sig   levothyroxine (SYNTHROID) 125 MCG tablet TAKE 1/2 TABLET BY MOUTH ONCE DAILY BEFORE BREAKFAST   No facility-administered encounter medications on file as of 02/16/2021.    ALLERGIES: No Known Allergies  VACCINATION STATUS:  There is no immunization history on file for this patient.   HPI  Kevin May is 50 y.o. male who is being seen in follow-up for RAI induced hypothyroidism.  He is currently on Levothyroxine 62.5 mcg p.o. daily before breakfast.    His previsit labs are consistent with appropriate replacement.  Has no new complaints today.  He continues to feel better, regained most of the weight he lost when he was hyperthyroid.   -He denies palpitations, tremors, nor heat intolerance.  he denies dysphagia, choking, shortness of breath, no recent voice change.   he reports  family history of thyroid dysfunction-various  degrees of thyroid dysfunction in his siblings and mother.  He denies family history of thyroid malignancy.    Review of systems  Constitutional: + steady body weight,  current Body mass index is 28.7 kg/m. , no fatigue, no subjective hyperthermia, no subjective hypothermia Eyes: no blurry vision, no xerophthalmia ENT: no sore throat, no nodules palpated in throat, no dysphagia/odynophagia, no hoarseness Cardiovascular: no chest pain, no shortness of breath, no palpitations, no leg swelling Respiratory: no cough, no shortness of breath Gastrointestinal: no nausea/vomiting/diarrhea Musculoskeletal: no muscle/joint aches Skin: no rashes, no hyperemia Neurological: no tremors, no numbness, no tingling, no dizziness Psychiatric: no depression, no anxiety  Objective:    BP 134/88   Pulse 73   Ht 5\' 10"  (1.778 m)   Wt 200 lb (90.7 kg)   BMI 28.70 kg/m   Wt Readings from Last 3 Encounters:  02/16/21 200 lb (90.7 kg)  08/18/20 202 lb 3.2 oz (91.7 kg)  04/16/20 189 lb (85.7 kg)    BP Readings from Last 3 Encounters:  02/16/21 134/88  08/18/20 (!) 143/88  12/03/19 139/85    Physical Exam- Limited  Constitutional:  Body mass index is 28.7 kg/m. , not in acute distress, normal state of mind Eyes:  EOMI, no exophthalmos Neck: Supple Cardiovascular: RRR, no murmurs, rubs, or gallops, no edema Respiratory: Adequate breathing efforts,  no crackles, rales, rhonchi, or wheezing Musculoskeletal: no gross deformities, strength intact in all four extremities, no gross restriction of joint movements Skin:  no rashes, no hyperemia Neurological: no tremor with outstretched hands                     Recent Results (from the past 2160 hour(s))  TSH     Status: None   Collection Time: 02/09/21 10:44 AM  Result Value Ref Range   TSH 2.370 0.450 - 4.500 uIU/mL  T4, free     Status: None   Collection Time: 02/09/21 10:44 AM  Result Value Ref Range   Free T4 1.45 0.82 - 1.77 ng/dL      -October 25, 2018 labs TSH 0.07, free T4 suppressed 0.62, thyroglobulin antibodies 43.3, TPO antibodies 559   Assessment & Plan:   1.  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  He is advised to continue Levothyroxine 62.5 mcg p.o. daily before breakfast.     - We discussed about the correct intake of his thyroid hormone, on empty stomach at fasting, with water, separated by at least 30 minutes from breakfast and other medications,  and separated by more than 4 hours from calcium, iron, multivitamins, acid reflux medications (PPIs). -Patient is made aware of the fact that thyroid hormone replacement is needed for life, dose to be adjusted by periodic monitoring of thyroid function tests.   - I advised him to maintain close follow up with Quintin Alto, NP for primary care needs.    I spent 22 minutes in the care of the patient today including review of labs from Thyroid Function, CMP, and other relevant labs ; imaging/biopsy records (current and previous including abstractions from other facilities); face-to-face time discussing  his lab results and symptoms, medications doses, his options of short and long term treatment based on the latest standards of care / guidelines;   and documenting the encounter.  Ronnette Hila  participated in the discussions, expressed understanding, and voiced agreement with the above plans.  All questions were answered to his satisfaction. he is encouraged to contact clinic should he have any questions or concerns prior to his return visit.  Follow up plan: Return in about 6 months (around 08/19/2021) for Thyroid follow up, Previsit labs.   Thank you for involving me in the care of this pleasant patient, and I will continue to update you with his progress.  Ronny Bacon, [facility] [address] [place], [place]  [address] Phone: [phone] Fax: [phone]  02/16/2021, 10:30 AM

## 2021-02-16 NOTE — Patient Instructions (Signed)

## 2021-05-30 ENCOUNTER — Other Ambulatory Visit: Payer: Self-pay | Admitting: Nurse Practitioner

## 2021-06-09 HISTORY — PX: TOOTH EXTRACTION: SUR596

## 2021-08-13 LAB — TSH: TSH: 2.27 u[IU]/mL (ref 0.450–4.500)

## 2021-08-13 LAB — T4, FREE: Free T4: 1.52 ng/dL (ref 0.82–1.77)

## 2021-08-19 ENCOUNTER — Encounter: Payer: Self-pay | Admitting: Nurse Practitioner

## 2021-08-19 ENCOUNTER — Ambulatory Visit (INDEPENDENT_AMBULATORY_CARE_PROVIDER_SITE_OTHER): Payer: Self-pay | Admitting: Nurse Practitioner

## 2021-08-19 ENCOUNTER — Other Ambulatory Visit: Payer: Self-pay

## 2021-08-19 VITALS — BP 134/86 | HR 76 | Ht 70.0 in | Wt 201.2 lb

## 2021-08-19 DIAGNOSIS — E890 Postprocedural hypothyroidism: Secondary | ICD-10-CM

## 2021-08-19 MED ORDER — LEVOTHYROXINE SODIUM 125 MCG PO TABS: 62.5000 ug | ORAL_TABLET | Freq: Every day | ORAL | 3 refills | Status: DC

## 2021-08-19 NOTE — Progress Notes (Signed)
08/19/2021                Endocrinology follow-up note   Subjective:    Patient ID: Kevin May, male    DOB: 1971-01-20, PCP [name], NP.   History reviewed. No pertinent past medical history. Past Surgical History:  Procedure Laterality Date   TOOTH EXTRACTION  06/09/2021   Will need to get an implant   Social History   Socioeconomic History   Marital status: Married    Spouse name: Not on file   Number of children: Not on file   Years of education: Not on file   Highest education level: Not on file  Occupational History   Not on file  Tobacco Use   Smoking status: Never   Smokeless tobacco: Never  Vaping Use   Vaping Use: Never used  Substance and Sexual Activity   Alcohol use: Not Currently   Drug use: Never   Sexual activity: Not on file  Other Topics Concern   Not on file  Social History Narrative   Not on file   Social Determinants of Health   Financial Resource Strain: Not on file  Food Insecurity: Not on file  Transportation Needs: Not on file  Physical Activity: Not on file  Stress: Not on file  Social Connections: Not on file   Outpatient Encounter Medications as of 08/19/2021  Medication Sig   [DISCONTINUED] levothyroxine (SYNTHROID) 125 MCG tablet TAKE 1/2 TABLET BY MOUTH ONCE DAILY BEFORE BREAKFAST   levothyroxine (SYNTHROID) 125 MCG tablet Take 0.5 tablets (62.5 mcg total) by mouth daily before breakfast.   No facility-administered encounter medications on file as of 08/19/2021.    ALLERGIES: No Known Allergies  VACCINATION STATUS:  There is no immunization history on file for this patient.   HPI  Kevin May is 51 y.o. male who is being seen in follow-up for RAI induced hypothyroidism.  He is currently on Levothyroxine 62.5 mcg p.o. daily before breakfast.    His previsit labs are consistent with appropriate replacement.  Has no new complaints today.  He continues to feel better, regained most of the weight he  lost when he was hyperthyroid.   -He denies palpitations, tremors, nor heat intolerance.  he denies dysphagia, choking, shortness of breath, no recent voice change.   he reports  family history of thyroid dysfunction-various degrees of thyroid dysfunction in his siblings and mother.  He denies family history of thyroid malignancy.    Review of systems  Constitutional: + Minimally fluctuating body weight,  current Body mass index is 28.87 kg/m. , no fatigue, no subjective hyperthermia, no subjective hypothermia Eyes: no blurry vision, no xerophthalmia ENT: no sore throat, no nodules palpated in throat, no dysphagia/odynophagia, no hoarseness Cardiovascular: no chest pain, no shortness of breath, no palpitations, no leg swelling Respiratory: no cough, no shortness of breath Gastrointestinal: no nausea/vomiting/diarrhea Musculoskeletal: no muscle/joint aches Skin: no rashes, no hyperemia Neurological: no tremors, no numbness, no tingling, no dizziness Psychiatric: no depression, no anxiety  Objective:    BP 134/86    Pulse 76    Ht 5\' 10"  (1.778 m)    Wt 201 lb 3.2 oz (91.3 kg)    SpO2 96%    BMI 28.87 kg/m   Wt Readings from Last 3 Encounters:  08/19/21 201 lb 3.2 oz (91.3 kg)  02/16/21 200 lb (90.7 kg)  08/18/20 202 lb 3.2 oz (91.7 kg)    BP  Readings from Last 3 Encounters:  08/19/21 134/86  02/16/21 134/88  08/18/20 (!) 143/88     Physical Exam- Limited  Constitutional:  Body mass index is 28.87 kg/m. , not in acute distress, normal state of mind Eyes:  EOMI, no exophthalmos Neck: Supple Cardiovascular: RRR, no murmurs, rubs, or gallops, no edema Respiratory: Adequate breathing efforts, no crackles, rales, rhonchi, or wheezing Musculoskeletal: no gross deformities, strength intact in all four extremities, no gross restriction of joint movements Skin:  no rashes, no hyperemia Neurological: no tremor with outstretched hands                     Recent Results (from  the past 2160 hour(s))  TSH     Status: None   Collection Time: 08/12/21 10:27 AM  Result Value Ref Range   TSH 2.270 0.450 - 4.500 uIU/mL  T4, free     Status: None   Collection Time: 08/12/21 10:27 AM  Result Value Ref Range   Free T4 1.52 0.82 - 1.77 ng/dL     -October 25, 2018 labs TSH 0.07, free T4 suppressed 0.62, thyroglobulin antibodies 43.3, TPO antibodies 559   Latest Reference Range & Units 11/28/19 10:09 04/07/20 10:14 08/12/20 00:00 02/09/21 10:44 08/12/21 10:27  TSH 0.450 - 4.500 uIU/mL 0.01 (L) 1.63 1.85 2.370 2.270  T4,Free(Direct) 0.82 - 1.77 ng/dL 1.5 1.2 1.2 7.32 2.02  (L): Data is abnormally low  Assessment & Plan:   1.  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  He is advised to continue Levothyroxine 62.5 mcg p.o. daily before breakfast.     - We discussed about the correct intake of his thyroid hormone, on empty stomach at fasting, with water, separated by at least 30 minutes from breakfast and other medications,  and separated by more than 4 hours from calcium, iron, multivitamins, acid reflux medications (PPIs). -Patient is made aware of the fact that thyroid hormone replacement is needed for life, dose to be adjusted by periodic monitoring of thyroid function tests.   - I advised him to maintain close follow up with Quintin Alto, NP for primary care needs.    I spent 20 minutes in the care of the patient today including review of labs from Thyroid Function, CMP, and other relevant labs ; imaging/biopsy records (current and previous including abstractions from other facilities); face-to-face time discussing  his lab results and symptoms, medications doses, his options of short and long term treatment based on the latest standards of care / guidelines;   and documenting the encounter.  Ronnette Hila  participated in the discussions, expressed understanding, and  voiced agreement with the above plans.  All questions were answered to his satisfaction. he is encouraged to contact clinic should he have any questions or concerns prior to his return visit.  Follow up plan: Return in about 1 year (around 08/19/2022) for Thyroid follow up, Previsit labs.   Thank you for involving me in the care of this pleasant patient, and I will continue to update you with his progress.  Ronny Bacon, [facility] [address] [place], [place] [address] Phone: [phone] Fax: [phone]  08/19/2021, 10:12 AM

## 2021-08-19 NOTE — Patient Instructions (Signed)

## 2022-08-17 LAB — TSH: TSH: 3.51 u[IU]/mL (ref 0.450–4.500)

## 2022-08-17 LAB — T4, FREE: Free T4: 1.47 ng/dL (ref 0.82–1.77)

## 2022-08-17 NOTE — Patient Instructions (Signed)

## 2022-08-19 ENCOUNTER — Encounter: Payer: Self-pay | Admitting: Nurse Practitioner

## 2022-08-19 ENCOUNTER — Ambulatory Visit (INDEPENDENT_AMBULATORY_CARE_PROVIDER_SITE_OTHER): Payer: Self-pay | Admitting: Nurse Practitioner

## 2022-08-19 VITALS — BP 134/83 | HR 69 | Ht 70.0 in | Wt 204.2 lb

## 2022-08-19 DIAGNOSIS — E890 Postprocedural hypothyroidism: Secondary | ICD-10-CM

## 2022-08-19 MED ORDER — LEVOTHYROXINE SODIUM 125 MCG PO TABS: 62.5000 ug | ORAL_TABLET | Freq: Every day | ORAL | 3 refills | Status: DC

## 2022-08-19 NOTE — Progress Notes (Signed)
08/19/2022                Endocrinology follow-up note   Subjective:    Patient ID: Kevin May, male    DOB: [birth year], PCP Jessee Avers April Y, NP.   History reviewed. No pertinent past medical history. Past Surgical History:  Procedure Laterality Date   TOOTH EXTRACTION  06/09/2021   Will need to get an implant   Social History   Socioeconomic History   Marital status: Married    Spouse name: Not on file   Number of children: Not on file   Years of education: Not on file   Highest education level: Not on file  Occupational History   Not on file  Tobacco Use   Smoking status: Never   Smokeless tobacco: Never  Vaping Use   Vaping Use: Never used  Substance and Sexual Activity   Alcohol use: Not Currently   Drug use: Never   Sexual activity: Not on file  Other Topics Concern   Not on file  Social History Narrative   Not on file   Social Determinants of Health   Financial Resource Strain: Not on file  Food Insecurity: Not on file  Transportation Needs: Not on file  Physical Activity: Not on file  Stress: Not on file  Social Connections: Not on file   Outpatient Encounter Medications as of 08/19/2022  Medication Sig   [DISCONTINUED] levothyroxine (SYNTHROID) 125 MCG tablet Take 0.5 tablets (62.5 mcg total) by mouth daily before breakfast.   levothyroxine (SYNTHROID) 125 MCG tablet Take 0.5 tablets (62.5 mcg total) by mouth daily before breakfast.   No facility-administered encounter medications on file as of 08/19/2022.    ALLERGIES: No Known Allergies  VACCINATION STATUS:  There is no immunization history on file for this patient.   HPI  Kevin May is 52 y.o. male who is being seen in follow-up for RAI induced hypothyroidism.  He is currently on Levothyroxine 62.5 mcg p.o. daily before breakfast.    His previsit labs are consistent with appropriate replacement.  Has no new complaints today.  He continues to feel better, regained most of the  weight he lost when he was hyperthyroid.   -He denies palpitations, tremors, nor heat intolerance.  he denies dysphagia, choking, shortness of breath, no recent voice change.   he reports  family history of thyroid dysfunction-various degrees of thyroid dysfunction in his siblings and mother.  He denies family history of thyroid malignancy.    Review of systems  Constitutional: + Minimally fluctuating body weight,  current Body mass index is 29.3 kg/m. , no fatigue, no subjective hyperthermia, no subjective hypothermia Eyes: no blurry vision, no xerophthalmia ENT: no sore throat, no nodules palpated in throat, no dysphagia/odynophagia, no hoarseness Cardiovascular: no chest pain, no shortness of breath, no palpitations, no leg swelling Respiratory: no cough, no shortness of breath Gastrointestinal: no nausea/vomiting/diarrhea Musculoskeletal: no muscle/joint aches Skin: no rashes, no hyperemia Neurological: no tremors, no numbness, no tingling, no dizziness Psychiatric: no depression, no anxiety  Objective:    BP 134/83 (BP Location: Left Arm, Patient Position: Sitting, Cuff Size: Large)   Pulse 69   Ht 5\' 10"  (1.778 m)   Wt 204 lb 3.2 oz (92.6 kg)   BMI 29.30 kg/m   Wt Readings from Last 3 Encounters:  08/19/22 204 lb 3.2 oz (92.6 kg)  08/19/21 201 lb 3.2 oz (91.3 kg)  02/16/21 200 lb (90.7 kg)  BP Readings from Last 3 Encounters:  08/19/22 134/83  08/19/21 134/86  02/16/21 134/88    Physical Exam- Limited  Constitutional:  Body mass index is 29.3 kg/m. , not in acute distress, normal state of mind Eyes:  EOMI, no exophthalmos Musculoskeletal: no gross deformities, strength intact in all four extremities, no gross restriction of joint movements Skin:  no rashes, no hyperemia Neurological: no tremor with outstretched hands                     Recent Results (from the past 2160 hour(s))  TSH     Status: None   Collection Time: 08/16/22 10:19 AM  Result Value  Ref Range   TSH 3.510 0.450 - 4.500 uIU/mL  T4, free     Status: None   Collection Time: 08/16/22 10:19 AM  Result Value Ref Range   Free T4 1.47 0.82 - 1.77 ng/dL     -October 25, 2018 labs TSH 0.07, free T4 suppressed 0.62, thyroglobulin antibodies 43.3, TPO antibodies 559   Latest Reference Range & Units 11/28/19 10:09 04/07/20 10:14 08/12/20 00:00 02/09/21 10:44 08/12/21 10:27 08/16/22 10:19  TSH 0.450 - 4.500 uIU/mL 0.01 (L) 1.63 1.85 2.370 2.270 3.510  T4,Free(Direct) 0.82 - 1.77 ng/dL 1.5 1.2 1.2 1.45 1.52 1.47  (L): Data is abnormally low  Assessment & Plan:   1.  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  He is advised to continue Levothyroxine 62.5 mcg p.o. daily before breakfast (taking half of his 125 mcg tabs).     - We discussed about the correct intake of his thyroid hormone, on empty stomach at fasting, with water, separated by at least 30 minutes from breakfast and other medications,  and separated by more than 4 hours from calcium, iron, multivitamins, acid reflux medications (PPIs). -Patient is made aware of the fact that thyroid hormone replacement is needed for life, dose to be adjusted by periodic monitoring of thyroid function tests.   - I advised him to maintain close follow up with [name], NP for primary care needs.     I spent 23 minutes in the care of the patient today including review of labs from Thyroid Function, CMP, and other relevant labs ; imaging/biopsy records (current and previous including abstractions from other facilities); face-to-face time discussing  his lab results and symptoms, medications doses, his options of short and long term treatment based on the latest standards of care / guidelines;   and documenting the encounter.  [name]  participated in the discussions, expressed understanding, and voiced agreement with the above plans.   All questions were answered to his satisfaction. he is encouraged to contact clinic should he have any questions or concerns prior to his return visit.  Follow up plan: Return in about 1 year (around 08/20/2023) for Thyroid follow up, Previsit labs.   Thank you for involving me in the care of this pleasant patient, and I will continue to update you with his progress.  Rayetta Pigg, [facility] [address] [place], [place] [address] Phone: [phone] Fax: [phone]  08/19/2022, 9:37 AM

## 2023-08-06 ENCOUNTER — Other Ambulatory Visit: Payer: Self-pay | Admitting: Nurse Practitioner

## 2023-08-18 LAB — T4, FREE: Free T4: 1.38 ng/dL (ref 0.82–1.77)

## 2023-08-18 LAB — TSH: TSH: 2.9 u[IU]/mL (ref 0.450–4.500)

## 2023-08-21 ENCOUNTER — Ambulatory Visit: Payer: Self-pay | Admitting: Nurse Practitioner

## 2023-08-21 DIAGNOSIS — E890 Postprocedural hypothyroidism: Secondary | ICD-10-CM

## 2023-08-28 ENCOUNTER — Ambulatory Visit: Payer: Self-pay | Admitting: Nurse Practitioner

## 2023-08-28 DIAGNOSIS — E890 Postprocedural hypothyroidism: Secondary | ICD-10-CM

## 2023-08-29 ENCOUNTER — Ambulatory Visit (INDEPENDENT_AMBULATORY_CARE_PROVIDER_SITE_OTHER): Payer: Self-pay | Admitting: Nurse Practitioner

## 2023-08-29 ENCOUNTER — Encounter: Payer: Self-pay | Admitting: Nurse Practitioner

## 2023-08-29 VITALS — BP 134/84 | HR 81 | Ht 70.0 in | Wt 209.2 lb

## 2023-08-29 DIAGNOSIS — E890 Postprocedural hypothyroidism: Secondary | ICD-10-CM

## 2023-08-29 MED ORDER — LEVOTHYROXINE SODIUM 125 MCG PO TABS: 62.5000 ug | ORAL_TABLET | Freq: Every day | ORAL | 3 refills | Status: DC

## 2023-08-29 NOTE — Patient Instructions (Signed)

## 2023-08-29 NOTE — Progress Notes (Signed)
 08/29/2023                Endocrinology follow-up note   Subjective:    Patient ID: Kevin May, male    DOB: [birth year], PCP Renella April Y, NP.   History reviewed. No pertinent past medical history. Past Surgical History:  Procedure Laterality Date   TOOTH EXTRACTION  06/09/2021   Will need to get an implant   Social History   Socioeconomic History   Marital status: Married    Spouse name: Not on file   Number of children: Not on file   Years of education: Not on file   Highest education level: Not on file  Occupational History   Not on file  Tobacco Use   Smoking status: Never   Smokeless tobacco: Never  Vaping Use   Vaping status: Never Used  Substance and Sexual Activity   Alcohol use: Not Currently   Drug use: Never   Sexual activity: Not on file  Other Topics Concern   Not on file  Social History Narrative   Not on file   Social Drivers of Health   Financial Resource Strain: Not on file  Food Insecurity: Not on file  Transportation Needs: Not on file  Physical Activity: Not on file  Stress: Not on file  Social Connections: Not on file   Outpatient Encounter Medications as of 08/29/2023  Medication Sig   [DISCONTINUED] levothyroxine  (SYNTHROID ) 125 MCG tablet TAKE 1/2 TABLET (62.5 MCG TOTAL) BY MOUTH DAILY BEFORE BREAKFAST.   levothyroxine  (SYNTHROID ) 125 MCG tablet Take 0.5 tablets (62.5 mcg total) by mouth daily before breakfast.   No facility-administered encounter medications on file as of 08/29/2023.    ALLERGIES: No Known Allergies  VACCINATION STATUS:  There is no immunization history on file for this patient.   HPI  Kevin May is 53 y.o. male who is being seen in follow-up for RAI induced hypothyroidism.  He is currently on Levothyroxine  62.5 mcg p.o. daily before breakfast.    His previsit labs are consistent with appropriate replacement.  Has no new complaints today.  He continues to feel better, regained most of the  weight he lost when he was hyperthyroid.   -He denies palpitations, tremors, nor heat intolerance.  he denies dysphagia, choking, shortness of breath, no recent voice change.   he reports  family history of thyroid dysfunction-various degrees of thyroid dysfunction in his siblings and mother.  He denies family history of thyroid malignancy.   Review of systems  Constitutional: + Minimally fluctuating body weight,  current Body mass index is 30.02 kg/m. , no fatigue, no subjective hyperthermia, no subjective hypothermia Eyes: no blurry vision, no xerophthalmia ENT: no sore throat, no nodules palpated in throat, no dysphagia/odynophagia, no hoarseness Cardiovascular: no chest pain, no shortness of breath, no palpitations, no leg swelling Respiratory: no cough, no shortness of breath Gastrointestinal: no nausea/vomiting/diarrhea Musculoskeletal: no muscle/joint aches Skin: no rashes, no hyperemia Neurological: no tremors, no numbness, no tingling, no dizziness Psychiatric: no depression, no anxiety  Objective:    BP 134/84 (BP Location: Left Arm, Patient Position: Sitting, Cuff Size: Large)   Pulse 81   Ht 5' 10 (1.778 m)   Wt 209 lb 3.2 oz (94.9 kg)   BMI 30.02 kg/m   Wt Readings from Last 3 Encounters:  08/29/23 209 lb 3.2 oz (94.9 kg)  08/19/22 204 lb 3.2 oz (92.6 kg)  08/19/21 201 lb 3.2 oz (91.3 kg)  BP Readings from Last 3 Encounters:  08/29/23 134/84  08/19/22 134/83  08/19/21 134/86    Physical Exam- Limited  Constitutional:  Body mass index is 30.02 kg/m. , not in acute distress, normal state of mind Eyes:  EOMI, no exophthalmos Musculoskeletal: no gross deformities, strength intact in all four extremities, no gross restriction of joint movements Skin:  no rashes, no hyperemia Neurological: no tremor with outstretched hands                     Recent Results (from the past 2160 hours)  TSH     Status: None   Collection Time: 08/17/23 10:10 AM  Result  Value Ref Range   TSH 2.900 0.450 - 4.500 uIU/mL  T4, free     Status: None   Collection Time: 08/17/23 10:10 AM  Result Value Ref Range   Free T4 1.38 0.82 - 1.77 ng/dL     -October 25, 2018 labs TSH 0.07, free T4 suppressed 0.62, thyroglobulin antibodies 43.3, TPO antibodies 559   Latest Reference Range & Units 04/07/20 10:14 08/12/20 00:00 02/09/21 10:44 08/12/21 10:27 08/16/22 10:19 08/17/23 10:10  TSH 0.450 - 4.500 uIU/mL 1.63 1.85 2.370 2.270 3.510 2.900  T4,Free(Direct) 0.82 - 1.77 ng/dL 1.2 1.2 8.54 8.47 8.52 1.38    Assessment & Plan:   1.  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  He is advised to continue Levothyroxine  62.5 mcg p.o. daily before breakfast (taking half of his 125 mcg tabs).     - We discussed about the correct intake of his thyroid hormone, on empty stomach at fasting, with water, separated by at least 30 minutes from breakfast and other medications,  and separated by more than 4 hours from calcium, iron, multivitamins, acid reflux medications (PPIs). -Patient is made aware of the fact that thyroid hormone replacement is needed for life, dose to be adjusted by periodic monitoring of thyroid function tests.   - I advised him to maintain close follow up with Renella Izetta GRADE, NP for primary care needs.    I spent  13  minutes in the care of the patient today including review of labs from Thyroid Function, CMP, and other relevant labs ; imaging/biopsy records (current and previous including abstractions from other facilities); face-to-face time discussing  his lab results and symptoms, medications doses, his options of short and long term treatment based on the latest standards of care / guidelines;   and documenting the encounter.  Deward GORMAN Register  participated in the discussions, expressed understanding, and voiced agreement with the above plans.  All questions were  answered to his satisfaction. he is encouraged to contact clinic should he have any questions or concerns prior to his return visit.  Follow up plan: Return in about 1 year (around 08/28/2024) for Thyroid follow up, Previsit labs.   Thank you for involving me in the care of this pleasant patient, and I will continue to update you with his progress.  Benton Rio, [facility] [address] [place], [place] [address] Phone: [phone] Fax: [phone]  08/29/2023, 9:33 AM

## 2024-08-20 ENCOUNTER — Other Ambulatory Visit: Payer: Self-pay | Admitting: *Deleted

## 2024-08-20 DIAGNOSIS — E890 Postprocedural hypothyroidism: Secondary | ICD-10-CM

## 2024-08-26 NOTE — Patient Instructions (Signed)

## 2024-08-27 LAB — TSH: TSH: 4.06 u[IU]/mL (ref 0.450–4.500)

## 2024-08-27 LAB — T4, FREE: Free T4: 1.43 ng/dL (ref 0.82–1.77)

## 2024-08-28 ENCOUNTER — Ambulatory Visit: Payer: Self-pay | Admitting: Nurse Practitioner

## 2024-08-28 ENCOUNTER — Encounter: Payer: Self-pay | Admitting: Nurse Practitioner

## 2024-08-28 VITALS — BP 132/82 | HR 87 | Ht 70.0 in | Wt 207.2 lb

## 2024-08-28 DIAGNOSIS — E890 Postprocedural hypothyroidism: Secondary | ICD-10-CM

## 2024-08-28 MED ORDER — LEVOTHYROXINE SODIUM 125 MCG PO TABS: 62.5000 ug | ORAL_TABLET | Freq: Every day | ORAL | 3 refills | Status: AC

## 2024-08-28 NOTE — Progress Notes (Signed)
 "          08/28/2024                Endocrinology follow-up note   Subjective:    Patient ID: Kevin May, male    DOB: 1971/02/12, PCP Renella April Y, NP.   History reviewed. No pertinent past medical history. Past Surgical History:  Procedure Laterality Date   TOOTH EXTRACTION  06/09/2021   Will need to get an implant   Social History   Socioeconomic History   Marital status: Married    Spouse name: Not on file   Number of children: Not on file   Years of education: Not on file   Highest education level: Not on file  Occupational History   Not on file  Tobacco Use   Smoking status: Never   Smokeless tobacco: Never  Vaping Use   Vaping status: Never Used  Substance and Sexual Activity   Alcohol use: Not Currently   Drug use: Never   Sexual activity: Not on file  Other Topics Concern   Not on file  Social History Narrative   Not on file   Social Drivers of Health   Tobacco Use: Low Risk (08/28/2024)   Patient History    Smoking Tobacco Use: Never    Smokeless Tobacco Use: Never    Passive Exposure: Not on file  Financial Resource Strain: Not on file  Food Insecurity: Not on file  Transportation Needs: Not on file  Physical Activity: Not on file  Stress: Not on file  Social Connections: Not on file  Depression (EYV7-0): Not on file  Alcohol Screen: Not on file  Housing: Not on file  Utilities: Not on file  Health Literacy: Not on file   Outpatient Encounter Medications as of 08/28/2024  Medication Sig   levothyroxine  (SYNTHROID ) 125 MCG tablet Take 0.5 tablets (62.5 mcg total) by mouth daily before breakfast.   [DISCONTINUED] levothyroxine  (SYNTHROID ) 125 MCG tablet Take 0.5 tablets (62.5 mcg total) by mouth daily before breakfast.   No facility-administered encounter medications on file as of 08/28/2024.    ALLERGIES: No Known Allergies  VACCINATION STATUS:  There is no immunization history on file for this patient.   HPI  Kevin May  is 54 y.o. male who is being seen in follow-up for RAI induced hypothyroidism.  He is currently on Levothyroxine  62.5 mcg p.o. daily before breakfast.    His previsit labs are consistent with appropriate replacement.  Has no new complaints today.  He continues to feel better, regained most of the weight he lost when he was hyperthyroid.   -He denies palpitations, tremors, nor heat intolerance.  he denies dysphagia, choking, shortness of breath, no recent voice change.   he reports  family history of thyroid dysfunction-various degrees of thyroid dysfunction in his siblings and mother.  He denies family history of thyroid malignancy.   Review of systems  Constitutional: + Minimally fluctuating body weight,  current Body mass index is 29.73 kg/m. , no fatigue, no subjective hyperthermia, no subjective hypothermia Eyes: no blurry vision, no xerophthalmia ENT: no sore throat, no nodules palpated in throat, no dysphagia/odynophagia, no hoarseness Cardiovascular: no chest pain, no shortness of breath, no palpitations, no leg swelling Respiratory: no cough, no shortness of breath Gastrointestinal: no nausea/vomiting/diarrhea Musculoskeletal: no muscle/joint aches Skin: no rashes, no hyperemia Neurological: no tremors, no numbness, no tingling, no dizziness Psychiatric: no depression, no anxiety  Objective:    BP 132/82 (BP Location: Left  Arm, Patient Position: Sitting, Cuff Size: Large)   Pulse 87   Ht 5' 10 (1.778 m)   Wt 207 lb 3.2 oz (94 kg)   BMI 29.73 kg/m   Wt Readings from Last 3 Encounters:  08/28/24 207 lb 3.2 oz (94 kg)  08/29/23 209 lb 3.2 oz (94.9 kg)  08/19/22 204 lb 3.2 oz (92.6 kg)    BP Readings from Last 3 Encounters:  08/28/24 132/82  08/29/23 134/84  08/19/22 134/83     Physical Exam- Limited  Constitutional:  Body mass index is 29.73 kg/m. , not in acute distress, normal state of mind Eyes:  EOMI, no exophthalmos Musculoskeletal: no gross deformities,  strength intact in all four extremities, no gross restriction of joint movements Skin:  no rashes, no hyperemia Neurological: no tremor with outstretched hands                     Recent Results (from the past 2160 hours)  T4, Free     Status: None   Collection Time: 08/26/24 10:25 AM  Result Value Ref Range   Free T4 1.43 0.82 - 1.77 ng/dL  TSH     Status: None   Collection Time: 08/26/24 10:25 AM  Result Value Ref Range   TSH 4.060 0.450 - 4.500 uIU/mL     -October 25, 2018 labs TSH 0.07, free T4 suppressed 0.62, thyroglobulin antibodies 43.3, TPO antibodies 559   Latest Reference Range & Units 02/09/21 10:44 08/12/21 10:27 08/16/22 10:19 08/17/23 10:10 08/26/24 10:25  TSH 0.450 - 4.500 uIU/mL 2.370 2.270 3.510 2.900 4.060  T4,Free(Direct) 0.82 - 1.77 ng/dL 8.54 8.47 8.52 8.61 8.56    Assessment & Plan:   1.  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  He is advised to continue Levothyroxine  62.5 mcg p.o. daily before breakfast (taking half of his 125 mcg tabs).     - The correct intake of thyroid hormone (Levothyroxine , Synthroid ), is on empty stomach first thing in the morning, with water, separated by at least 30 minutes from breakfast and other medications,  and separated by more than 4 hours from calcium, iron, multivitamins, acid reflux medications (PPIs).  - This medication is a life-long medication and will be needed to correct thyroid hormone imbalances for the rest of your life.  The dose may change from time to time, based on thyroid blood work.  - It is extremely important to be consistent taking this medication, near the same time each morning.  -AVOID TAKING PRODUCTS CONTAINING BIOTIN (commonly found in Hair, Skin, Nails vitamins) AS IT INTERFERES WITH THE VALIDITY OF THYROID FUNCTION BLOOD TESTS.   - I advised him to maintain close follow up with Renella Izetta GRADE, NP for  primary care needs.    I spent  12  minutes in the care of the patient today including review of labs from Thyroid Function, CMP, and other relevant labs ; imaging/biopsy records (current and previous including abstractions from other facilities); face-to-face time discussing  his lab results and symptoms, medications doses, his options of short and long term treatment based on the latest standards of care / guidelines;   and documenting the encounter.  Deward GORMAN Register  participated in the discussions, expressed understanding, and voiced agreement with the above plans.  All questions were answered to his satisfaction. he is encouraged to contact clinic should he have any questions or concerns prior to his  return visit.   Follow up plan: Return in about 1 year (around 08/28/2025) for Thyroid follow up, Previsit labs.   Thank you for involving me in the care of this pleasant patient, and I will continue to update you with his progress.  Benton Rio, [facility] [address] [place], [place] [address] Phone: [phone] Fax: [phone]  08/28/2024, 9:48 AM  "

## 2025-08-28 ENCOUNTER — Ambulatory Visit: Payer: Self-pay | Admitting: Nurse Practitioner
